# Patient Record
Sex: Female | Born: 2000 | Hispanic: No | Marital: Single | Smoking: Former smoker
Health system: Southern US, Community
[De-identification: ages and names within clinical notes are randomized; demographics above are authoritative.]

## PROBLEM LIST (undated history)

## (undated) DIAGNOSIS — F1210 Cannabis abuse, uncomplicated: Secondary | ICD-10-CM

## (undated) DIAGNOSIS — F1610 Hallucinogen abuse, uncomplicated: Secondary | ICD-10-CM

## (undated) DIAGNOSIS — F32A Depression, unspecified: Secondary | ICD-10-CM

## (undated) DIAGNOSIS — F329 Major depressive disorder, single episode, unspecified: Secondary | ICD-10-CM

## (undated) DIAGNOSIS — F913 Oppositional defiant disorder: Secondary | ICD-10-CM

---
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
## 2021-03-17 ENCOUNTER — Inpatient Hospital Stay: Admit: 2021-03-17 | Discharge: 2021-03-17 | Payer: BLUE CROSS/BLUE SHIELD

## 2021-03-17 LAB — INFLUENZA TYPE A/B RNA     (BH GH LMW Q YH)
BKR INFLUENZA A: NOT DETECTED
BKR INFLUENZA B: NOT DETECTED

## 2021-03-17 LAB — SARS COV-2 (COVID-19) RNA: BKR SARS-COV-2 RNA (COVID-19) (YH): NOT DETECTED

## 2021-03-17 NOTE — Discharge Instructions
Novel Coronavirus Disease 2019 (COVID-19)For any questions, please call [facility] COVID-19 Call Center at [phone], or toll-free at [phone].  Key PointsStay at home if you are sick. If you have questions about home quarantine or COVID-19, call the COVID-19 Call Center: [phone]. Please call ahead before visiting your doctor or any other healthcare facility.Wash your hands and cover your cough or sneeze. This is the best way to prevent spreading the disease. You can use a surgical facemask or cough/sneeze into your elbow. N-95 masks are not more effective and not necessary unless you are a Research scientist (physical sciences). Using or collecting N95 masks depletes critical supplies for hospitalized patients and healthcare workers. Seek further testing if instructed by your doctor. Call your doctor or the COVID-19 Call Center at [phone] for guidance. If you go to another emergency room or clinic for testing without guidance, you risk exposing yourself and others. You can self schedule by going to [email or web address] you were prescribed medications, please take them as directed. You do not need antibiotics for COVID-19 treatment. If you are already taking medications at home, continue to take them as instructed by your prescribing physician(s).Return to the emergency room if you develop difficulty breathing or other severe symptoms. Call 911 for guidance first, mention your worsening COVID-19 symptoms, and return by private car or ambulance. Avoid using public transportation to limit exposure to others. Do NOT visit another healthcare facility without speaking with 911, your doctor or the COVID-19 Call Center.  Novel Coronavirus Disease 2019 (COVID-19)				            [id number]  ([id number])Novel Coronavirus Disease 2019 (COVID-19)What happened during my emergency department visit today?Either you or your provider were concerned about the novel coronavirus disease 2019 (COVID-19) during your visit today. This packet contains information and guidance about COVID-19. It also includes the names of your emergency room team, all the tests that were ordered, any available results, and a list of treatments given (if any) during your visit. Please note that your emergency room diagnosis is preliminary and may not explain all your symptoms since it is based on just a single visit. Your best chance of recovery is to continue safe healthcare practices and maintain contact with the appropriate providers for further guidance and follow-up as instructed in this packet. You should also take your prescribed medications and undergo any additional testing recommended by your physician(s). If these instructions do not make sense to you or your caregiver(s) or if you have any questions, please call [phone] or toll-free, [phone].What is COVID-19?COVID-19 stands for ?coronavirus disease 2019.? This is a respiratory illness that can spread from person to person. Like the flu or common cold, COVID-19 is spread by droplets produced by infected persons when they sneeze or cough. The risk of infection is thought to be greatest when you are in prolonged close contact with an infected person (such as a family member). While it may also be possible to catch the virus by touching an infected surface and then touching your nose or mouth, this is probably not the primary way that the virus spreads. Because of this, practicing social distancing (such as avoiding travel, standing at least 6 feet away from other people, and avoiding shaking hands) is recommended. Masks should be worn in public, and in your home if you are unable to isolate yourself from others in your home.  Unlike the flu or common cold, the symptoms of COVID-19 tend to be more gradual. The most common symptoms are fever, cough and body aches.  The elderly, immunocompromised and people with lung or heart disease are at the highest risk of becoming very ill. Is there any treatment for Covid-19?Currently there are some medications that can be prescribed for the treatment of COVID-19 for eligible patients. Please call your doctor or the COVID-19 call center [phone]) to determine if you are eligible for a treatment referral. Antibiotics do not treat COVID-19 since it is viral, not bacterial. Most young and otherwise healthy people will get better at home in 1-2 weeks with supportive care, which includes rest, hydration and medicine to lower your fever such as acetaminophen (Tylenol) and ibuprofen (Motrin, Advil).What are my next steps?If you have further questions, call the [facility] COVID-19 Call Center at [phone] or toll-free at [phone]. Get the flu vaccine or a booster if you do not have one yet. Encourage your family members to do the same, especially if they are elderly.Stay at home if you are sick. If you need further guidance on home quarantine or have questions about COVID-19, call COVID-19 Call Center. Please call ahead before visiting your doctor.Wash your hands and cover your cough or sneeze. You can use a surgical facemask or cough/sneeze into your elbow. Dispose of tissues in a lined trash can and immediately wash your hands for at least 20 seconds. If soap and water are not available, you can use an alcohol-based hand sanitizer containing 60-95% alcohol, rubbing your hands together until they are dry. Soap and water are recommended for visibly soiled hands. Do not share dishes, bedding or other household items with others unless thoroughly washed. This is the best way to prevent spreading the disease. N-95 masks are NOT more effective than surgical facemasks and are only recommended for healthcare workers. Using or collecting N95 masks depletes critical supplies for hospitalized patients and healthcare workers. Seek further testing if instructed by your doctor. Call your doctor or the COVID-19 Call Center at [phone] for guidance. If you go to another emergency room or clinic for testing without guidance, you risk exposing yourself and others. You can self schedule by going to [email or web address] NOT visit another healthcare facility without first speaking with your doctor or our follow-up office. Contact your primary care doctor to evaluate your progress and recovery if instructed to do so. If you do not have a primary care doctor, you can call [phone] for a [facility] physician, or [phone] for more information or assistance in selecting a doctor. [facility] members should call the [facility] COVID-19 hotline at [phone].Please avoid social stigma or discrimination against Asian Americans, infected individuals and those under quarantine. Stigma hurts everyone by creating fear, anger and anxiety towards ordinary people without treating or preventing the underlying disease.What should I watch out for in the next few days?Currently, there is no approved treatment for COVID-19. If you start to develop any of the symptoms listed below, or your current symptoms worsen, call your healthcare provider or the COVID-19 Call Center for guidance. Call 911 if it is a medical emergency. Worsening fatigue/malaiseShortness of breathInability to keep fluids downNoticing darkened or decreased urine outputConfusion or altered mental statusAny other concerning symptomsWhere can I find more information about COVID-19?Since this is a new disease caused by a new virus, scientists and researchers are discovering new information every day. We strongly encourage you to check for updates from these official sources:The Centers for Disease Control and Prevention (CDC) COVID-19 https://dennis.info/ World Health Organization (WHO) Q&A on COVID-19https://www.who.int/news-room/q-a-detail/q-a-coronavirusesY[facility] http://morrow-smith.net/ [facility]  COVID-19 Vaccination [email or web address] [email or web address]

## 2021-03-19 NOTE — ED Provider Notes
HistoryChief Complaint Patient presents with ? Sore Throat   Sorethroat  congestion runny nose      cough   Patient presents to the ED complaining of cough sore throat nasal congestion and runny nose for the past 2 days.  She is otherwise denying complaints.  No feverchest pain trouble breathing nausea vomiting abdominal pain urinary complaints any other complaints. No past medical history on file.No past surgical history on file.No family history on file.Social History Socioeconomic History ? Marital status: Single No existing history information found.No existing history information found.No existing history information found.Review of Systems Constitutional: Negative for appetite change and fever. HENT: Positive for rhinorrhea and sore throat.  Respiratory: Positive for cough. Negative for shortness of breath.  Cardiovascular: Negative.  Negative for chest pain. Gastrointestinal: Negative.  Negative for abdominal pain, nausea and vomiting. Genitourinary: Negative for dysuria and hematuria. Musculoskeletal: Negative.  Skin: Negative.  Negative for rash. Neurological: Negative.  Negative for headaches. All other systems reviewed and are negative. Physical ExamED Triage Vitals [03/17/21 1508]BP: (!) 120/57Pulse: 88Pulse from  O2 sat: n/aResp: 20Temp: 97.6 ?F (36.4 ?C)Temp src: TemporalSpO2: 96 % BP (!) 120/57  - Pulse 88  - Temp 97.6 ?F (36.4 ?C) (Temporal)  - Resp 20  - Wt 112 kg (246 lb 14.6 oz)  - SpO2 96% Physical ExamConstitutional:     Appearance: She is well-developed. HENT:    Head: Normocephalic.    Right Ear: Tympanic membrane normal.    Left Ear: Tympanic membrane normal.    Nose: Congestion present.    Mouth/Throat:    Mouth: Mucous membranes are moist. No oral lesions.    Pharynx: Oropharynx is clear. No pharyngeal swelling, oropharyngeal exudate, posterior oropharyngeal erythema or uvula swelling. Eyes:    Conjunctiva/sclera: Conjunctivae normal. Cardiovascular:    Rate and Rhythm: Normal rate. Pulmonary:    Effort: Pulmonary effort is normal. No respiratory distress. Musculoskeletal:       General: Normal range of motion.    Cervical back: Normal range of motion. Neurological:    Mental Status: She is alert and oriented to person, place, and time.  ProceduresProcedures ED Course Patient has above.  Normal exam.  Afebrile well-appearing.  Likely has viral illness given duration of symptoms and normal findings on exam.  Advised to follow-up with PMD.  COVID test ordered and sent.  Patient given strict return precautions.  Return precautions were advised for worsening and/or persistent symptoms, otherwise should f/u with pmd.  Pt and family informed of test results, my clinical impression, treatment recommendations and disposition plan. Additional verbal discharge instructions also provided.  All questions answered to their satisfaction and they expressed understanding and comfort with this.   At the time of discharge, the patient is alert, ambulatory, improved, tolerating po and understands instructions.Clinical Impressions as of 03/18/21 2347 Viral respiratory infection - COVID-19 not excluded  ED DispositionDischarge Javier Glazier, MD06/23/22 2348
